# Patient Record
Sex: Male | Born: 1985 | Race: Black or African American | Hispanic: No | Marital: Single | Smoking: Current every day smoker
Health system: Southern US, Community
[De-identification: ages and names within clinical notes are randomized; demographics above are authoritative.]

---

## 2016-09-17 ENCOUNTER — Encounter (HOSPITAL_BASED_OUTPATIENT_CLINIC_OR_DEPARTMENT_OTHER): Payer: Self-pay | Admitting: Emergency Medicine

## 2016-09-17 ENCOUNTER — Emergency Department (HOSPITAL_BASED_OUTPATIENT_CLINIC_OR_DEPARTMENT_OTHER)
Admission: EM | Admit: 2016-09-17 | Discharge: 2016-09-17 | Disposition: A | Discharge status: Home / Self Care | Payer: Commercial Managed Care - PPO | Attending: Emergency Medicine | Admitting: Emergency Medicine

## 2016-09-17 ENCOUNTER — Ambulatory Visit (HOSPITAL_BASED_OUTPATIENT_CLINIC_OR_DEPARTMENT_OTHER)
Admission: RE | Admit: 2016-09-17 | Discharge: 2016-09-17 | Disposition: A | Discharge status: Home / Self Care | Payer: Commercial Managed Care - PPO | Source: Ambulatory Visit | Attending: Family Medicine | Admitting: Family Medicine

## 2016-09-17 ENCOUNTER — Other Ambulatory Visit (HOSPITAL_BASED_OUTPATIENT_CLINIC_OR_DEPARTMENT_OTHER): Payer: Self-pay | Admitting: Family Medicine

## 2016-09-17 DIAGNOSIS — S90852A Superficial foreign body, left foot, initial encounter: Secondary | ICD-10-CM | POA: Insufficient documentation

## 2016-09-17 DIAGNOSIS — W25XXXA Contact with sharp glass, initial encounter: Secondary | ICD-10-CM

## 2016-09-17 DIAGNOSIS — T1490XA Injury, unspecified, initial encounter: Secondary | ICD-10-CM

## 2016-09-17 DIAGNOSIS — W458XXA Other foreign body or object entering through skin, initial encounter: Secondary | ICD-10-CM | POA: Diagnosis not present

## 2016-09-17 DIAGNOSIS — Y929 Unspecified place or not applicable: Secondary | ICD-10-CM | POA: Insufficient documentation

## 2016-09-17 DIAGNOSIS — Y999 Unspecified external cause status: Secondary | ICD-10-CM | POA: Insufficient documentation

## 2016-09-17 DIAGNOSIS — Y939 Activity, unspecified: Secondary | ICD-10-CM | POA: Insufficient documentation

## 2016-09-17 DIAGNOSIS — F17200 Nicotine dependence, unspecified, uncomplicated: Secondary | ICD-10-CM | POA: Diagnosis not present

## 2016-09-17 MED ORDER — CEPHALEXIN 500 MG PO CAPS: 500.0000 mg | ORAL_CAPSULE | Freq: Four times a day (QID) | ORAL | 0 refills | Status: AC

## 2016-09-17 MED ORDER — LIDOCAINE-EPINEPHRINE (PF) 2 %-1:200000 IJ SOLN
20.0000 mL | Freq: Once | INTRAMUSCULAR | Status: AC
  Administered 2016-09-17: 20 mL
  Filled 2016-09-17: qty 20

## 2016-09-17 NOTE — ED Notes (Signed)
ED Provider at bedside. 

## 2016-09-17 NOTE — ED Triage Notes (Signed)
pateint states that he has pieces of glass embedded in his left heel

## 2016-09-17 NOTE — ED Notes (Signed)
Pt verbalized understanding of discharge instructions and denies any further questions at this time.   

## 2016-09-17 NOTE — ED Provider Notes (Signed)
[facility]   CSN: [id number] Arrival date & time: 09/17/16  1639  By signing my name below, I, [name], attest that this documentation has been prepared under the direction and in the presence of [name], PA-C. Electronically Signed: [name], ED Scribe. 09/17/16. 5:01 PM.  History   Chief Complaint Chief Complaint  Patient presents with  . Foreign Body in Skin    The history is provided by the patient. No language interpreter was used.    HPI Comments: Francisco Goodwin is a 31 y.o. male who presents to the Emergency Department complaining of mild, constant left foot pain s/p a foreign body injury that occurred last night. The pt reports stepping on a broken picture frame dropped by his daughter. The pt states that some of the foreign body was already removed and the area has already been X- rayed. His work x-ray was foot he has retained glass and came to the ED for removal. Patient is able to walk on his foot with little bit of pain. No other treatments prior to arrival. Tetanus is UTD. The pt denies numbness and fever.   History reviewed. No pertinent past medical history.  There are no active problems to display for this patient.   History reviewed. No pertinent surgical history.     Home Medications    Prior to Admission medications   Not on File    Family History History reviewed. No pertinent family history.  Social History Social History  Substance Use Topics  . Smoking status: Current Every Day Smoker  . Smokeless tobacco: Never Used  . Alcohol use No     Allergies   Patient has no known allergies.   Review of Systems Review of Systems  Constitutional: Negative for fever.  Musculoskeletal: Negative for gait problem.  Skin: Positive for wound.  Neurological: Negative for weakness and numbness.     Physical Exam Updated Vital Signs BP 108/72 (BP Location: Right Arm)   Pulse (!) 56   Temp 98.3  F (36.8 C) (Oral)   Resp 16   Ht 5\' 8"  (1.727 m)   Wt 68 kg (150 lb)   SpO2 99%   BMI 22.81 kg/m   Physical Exam  Constitutional: He is oriented to person, place, and time. He appears well-developed and well-nourished.  HENT:  Head: Normocephalic.  Eyes: EOM are normal.  Neck: Normal range of motion.  Cardiovascular:  Pulses:      Femoral pulses are 2+ on the right side, and 2+ on the left side. Pulmonary/Chest: Effort normal.  Abdominal: He exhibits no distension.  Musculoskeletal: Normal range of motion.  DP pulses are 2+ bilaterally. Sensation to sharp dull. Cap refill normal.  Neurological: He is alert and oriented to person, place, and time. No sensory deficit. He exhibits normal muscle tone.  Skin: Skin is warm and dry. Capillary refill takes less than 2 seconds.  Small puncture to the bottom plantar region of L heel. Minimal bleeding noted. No overlaying infection. Mildly tender to palpation   Psychiatric: He has a normal mood and affect.  Nursing note and vitals reviewed.    ED Treatments / Results   DIAGNOSTIC STUDIES: Oxygen Saturation is 100% on RA, normal by my interpretation.   COORDINATION OF CARE: 4:53 PM-Discussed next steps with pt. Pt verbalized understanding and is agreeable with the plan.   Labs (all labs ordered are listed, but only abnormal results are displayed) Labs Reviewed - No data to display  EKG  EKG Interpretation None       Radiology Dg Os Calcis Left  Result Date: 09/17/2016 CLINICAL DATA:  The patient stepped on a piece of glass last night. EXAM: LEFT OS CALCIS - 2+ VIEW COMPARISON:  None. FINDINGS: There are 2 fragments of glass in the soft tissues of the heel immediately adjacent to 1 another, 1 small and 1 11 mm in length. The bones are normal. IMPRESSION: 2 shards of glass in the soft tissues of the heel. Electronically Signed   By: Francene BoyersJames  Maxwell M.D.   On: 09/17/2016 16:16    Procedures .Foreign Body Removal Date/Time:  09/18/2016 3:56 PM Performed by: Demetrios LollLEAPHART, Jovontae Banko T Authorized by: Demetrios LollLEAPHART, Decoda Van T  Consent: Verbal consent obtained. Risks and benefits: risks, benefits and alternatives were discussed Consent given by: patient Patient understanding: patient states understanding of the procedure being performed Patient consent: the patient's understanding of the procedure matches consent given Imaging studies: imaging studies available Patient identity confirmed: verbally with patient Body area: skin General location: lower extremity Location details: left foot Anesthesia: local infiltration  Anesthesia: Local Anesthetic: lidocaine 2% with epinephrine Anesthetic total: 10 mL Removal mechanism: alligator forceps, hemostat and scalpel Dressing: antibiotic ointment and dressing applied Depth: deep Complexity: complex 2 objects recovered. Objects recovered: 2 pieces of glass  Post-procedure assessment: foreign body removed Patient tolerance: Patient tolerated the procedure well with no immediate complications .Marland Kitchen.Laceration Repair Date/Time: 09/18/2016 3:58 PM Performed by: Rise MuLEAPHART, Magdalina Whitehead T Authorized by: Demetrios LollLEAPHART, Verdene Creson T   Consent:    Consent obtained:  Verbal   Consent given by:  Patient   Risks discussed:  Infection, need for additional repair, nerve damage, poor wound healing, poor cosmetic result, pain, retained foreign body, tendon damage and vascular damage   Alternatives discussed:  No treatment Anesthesia (see MAR for exact dosages):    Anesthesia method:  Local infiltration   Local anesthetic:  Lidocaine 2% WITH epi Laceration details:    Location:  Foot   Foot location:  R heel   Length (cm):  1   Depth (mm):  5 Repair type:    Repair type:  Simple Pre-procedure details:    Preparation:  Patient was prepped and draped in usual sterile fashion and imaging obtained to evaluate for foreign bodies Exploration:    Hemostasis achieved with:  Direct pressure   Wound  exploration: wound explored through full range of motion and entire depth of wound probed and visualized     Wound extent: no foreign bodies/material noted     Contaminated: no   Treatment:    Area cleansed with:  Betadine and saline   Amount of cleaning:  Extensive   Irrigation solution:  Sterile saline   Irrigation volume:  100   Irrigation method:  Pressure wash   Visualized foreign bodies/material removed: no   Skin repair:    Repair method:  Sutures   Suture size:  4-0   Suture material:  Prolene   Suture technique:  Simple interrupted   Number of sutures:  3 Approximation:    Approximation:  Close   Vermilion border: well-aligned   Post-procedure details:    Dressing:  Antibiotic ointment, adhesive bandage and bulky dressing   Patient tolerance of procedure:  Tolerated well, no immediate complications   (including critical care time)  Medications Ordered in ED Medications  lidocaine-EPINEPHrine (XYLOCAINE W/EPI) 2 %-1:200000 (PF) injection 20 mL (20 mLs Infiltration Given 09/17/16 1715)     Initial Impression / Assessment and Plan /  ED Course  I have reviewed the triage vital signs and the nursing notes.  Pertinent labs & imaging results that were available during my care of the patient were reviewed by me and considered in my medical decision making (see chart for details).     Patient presents to the ED with complaints of retained foreign body in the right foot. Patient stepped on piece of glass last night. Pulse small piece out but had retained glass that was seen [facility] today where he had an x-ray performed. Local infiltrate was used to numb the area. Foreign body was tried to retain with hemostats and forceps unable to do so. had to advance with small incision to open the area to allow for further exploration. Was able to remove 2 pieces of glass. Minimal bleeding. Patient was neurovascularly intact. 3 sutures were placed after  significant amount of irrigation due to open wound. Patient was started on prophylactic antibiotics given the time of injury and procedure performed today. Was given podiatry follow-up. Will need wound recheck in 2-3 days. Patient verbalized understanding with the plan of care. All questions answered prior to discharge. Patient seen and evaluated by Dr. Criss Alvine who is agreeable to above plan.  Final Clinical Impressions(s) / ED Diagnoses   Final diagnoses:  Foreign body in left foot, initial encounter    New Prescriptions Discharge Medication List as of 09/17/2016  6:25 PM    START taking these medications   Details  cephALEXin (KEFLEX) 500 MG capsule Take 1 capsule (500 mg total) by mouth 4 (four) times daily., Starting Mon 09/17/2016, Print       I personally performed the services described in this documentation, which was scribed in my presence. The recorded information has been reviewed and is accurate.     [name], PA-C 09/18/16 1601    Pricilla Loveless, MD 09/20/16 [phone]

## 2016-09-17 NOTE — Discharge Instructions (Signed)
Take the antibiotic as prescribed. Follow-up with the foot center. Wound recheck and to 3 days. Sutures out in 4-5 days. Rest, ice, elevate the foot. Keep the wound clean and dry with antibiotic ointment. Watch for signs of infection and return to ED sooner if this develops.

## 2016-09-21 ENCOUNTER — Emergency Department (HOSPITAL_BASED_OUTPATIENT_CLINIC_OR_DEPARTMENT_OTHER)
Admission: EM | Admit: 2016-09-21 | Discharge: 2016-09-21 | Disposition: A | Discharge status: Home / Self Care | Payer: Commercial Managed Care - PPO | Attending: Emergency Medicine | Admitting: Emergency Medicine

## 2016-09-21 ENCOUNTER — Encounter (HOSPITAL_BASED_OUTPATIENT_CLINIC_OR_DEPARTMENT_OTHER): Payer: Self-pay | Admitting: Emergency Medicine

## 2016-09-21 DIAGNOSIS — Y929 Unspecified place or not applicable: Secondary | ICD-10-CM | POA: Insufficient documentation

## 2016-09-21 DIAGNOSIS — X58XXXA Exposure to other specified factors, initial encounter: Secondary | ICD-10-CM | POA: Diagnosis not present

## 2016-09-21 DIAGNOSIS — S99822D Other specified injuries of left foot, subsequent encounter: Secondary | ICD-10-CM | POA: Diagnosis not present

## 2016-09-21 DIAGNOSIS — F17200 Nicotine dependence, unspecified, uncomplicated: Secondary | ICD-10-CM | POA: Diagnosis not present

## 2016-09-21 DIAGNOSIS — Z4802 Encounter for removal of sutures: Secondary | ICD-10-CM

## 2016-09-21 DIAGNOSIS — S99922D Unspecified injury of left foot, subsequent encounter: Secondary | ICD-10-CM | POA: Diagnosis present

## 2016-09-21 DIAGNOSIS — Y999 Unspecified external cause status: Secondary | ICD-10-CM | POA: Insufficient documentation

## 2016-09-21 DIAGNOSIS — Y939 Activity, unspecified: Secondary | ICD-10-CM | POA: Diagnosis not present

## 2016-09-21 NOTE — ED Provider Notes (Signed)
  [facility]   CSN: [id number] Arrival date & time: 09/21/16  1456     History   Chief Complaint Chief Complaint  Patient presents with  . Suture / Staple Removal    HPI Francisco Goodwin is a 31 y.o. male.  HPI Patient presents the emergency department requesting suture removal from his left heel.  These were placed 10 days ago.  His had no complications since then.    History reviewed. No pertinent past medical history.  There are no active problems to display for this patient.   History reviewed. No pertinent surgical history.     Home Medications    Prior to Admission medications   Medication Sig Start Date End Date Taking? Authorizing Provider  cephALEXin (KEFLEX) 500 MG capsule Take 1 capsule (500 mg total) by mouth 4 (four) times daily. 09/17/16   Rise MuLeaphart, Kenneth T, PA-C    Family History History reviewed. No pertinent family history.  Social History Social History  Substance Use Topics  . Smoking status: Current Every Day Smoker  . Smokeless tobacco: Never Used  . Alcohol use No     Allergies   Patient has no known allergies.   Review of Systems Review of Systems  All other systems reviewed and are negative.    Physical Exam Updated Vital Signs BP 107/73 (BP Location: Left Arm)   Pulse (!) 59   Temp 98.4 F (36.9 C) (Oral)   Resp 16   Ht 5\' 8"  (1.727 m)   Wt 68 kg (150 lb)   SpO2 99%   BMI 22.81 kg/m   Physical Exam  Constitutional: He is oriented to person, place, and time. He appears well-developed and well-nourished.  HENT:  Head: Normocephalic.  Eyes: EOM are normal.  Neck: Normal range of motion.  Pulmonary/Chest: Effort normal.  Abdominal: He exhibits no distension.  Musculoskeletal:  Sutures in place in left heel without secondary signs of infection.  Neurological: He is alert and oriented to person, place, and time.  Psychiatric: He has a normal mood and affect.  Nursing note and vitals  reviewed.    ED Treatments / Results  Labs (all labs ordered are listed, but only abnormal results are displayed) Labs Reviewed - No data to display  EKG  EKG Interpretation None       Radiology No results found.  Procedures .Suture Removal Performed by: Azalia BilisAMPOS, Macyn Shropshire Authorized by: Azalia BilisAMPOS, Madoline Bhatt    Consent: Verbal consent obtained. Patient identity confirmed: provided demographic data Time out: Immediately prior to procedure a "time out" was called to verify the correct patient, procedure, equipment, support staff and site/side marked as required. Location: left heel Wound Appearance: clean Sutures/Staples Removed: 3 Patient tolerance: Patient tolerated the procedure well with no immediate complications.    Medications Ordered in ED Medications - No data to display   Initial Impression / Assessment and Plan / ED Course  I have reviewed the triage vital signs and the nursing notes.  Pertinent labs & imaging results that were available during my care of the patient were reviewed by me and considered in my medical decision making (see chart for details).     No secondary signs of infection.  Sutures removed without difficulty  Final Clinical Impressions(s) / ED Diagnoses   Final diagnoses:  Visit for suture removal    New Prescriptions New Prescriptions   No medications on file     Azalia Bilisampos, Cooper Moroney, MD 09/21/16 1512

## 2016-09-21 NOTE — ED Notes (Signed)
ED Provider at bedside. 

## 2016-09-21 NOTE — ED Triage Notes (Signed)
Reports to ED for suture removal.

## 2019-02-02 IMAGING — DX DG OS CALCIS 2+V*L*
2 series · 2 of 2 positions shown · non-contrast
Comparison: None.

CLINICAL DATA: The patient stepped on a piece of glass last night.

EXAM:
LEFT OS CALCIS - 2+ VIEW

[calcaneus axial]
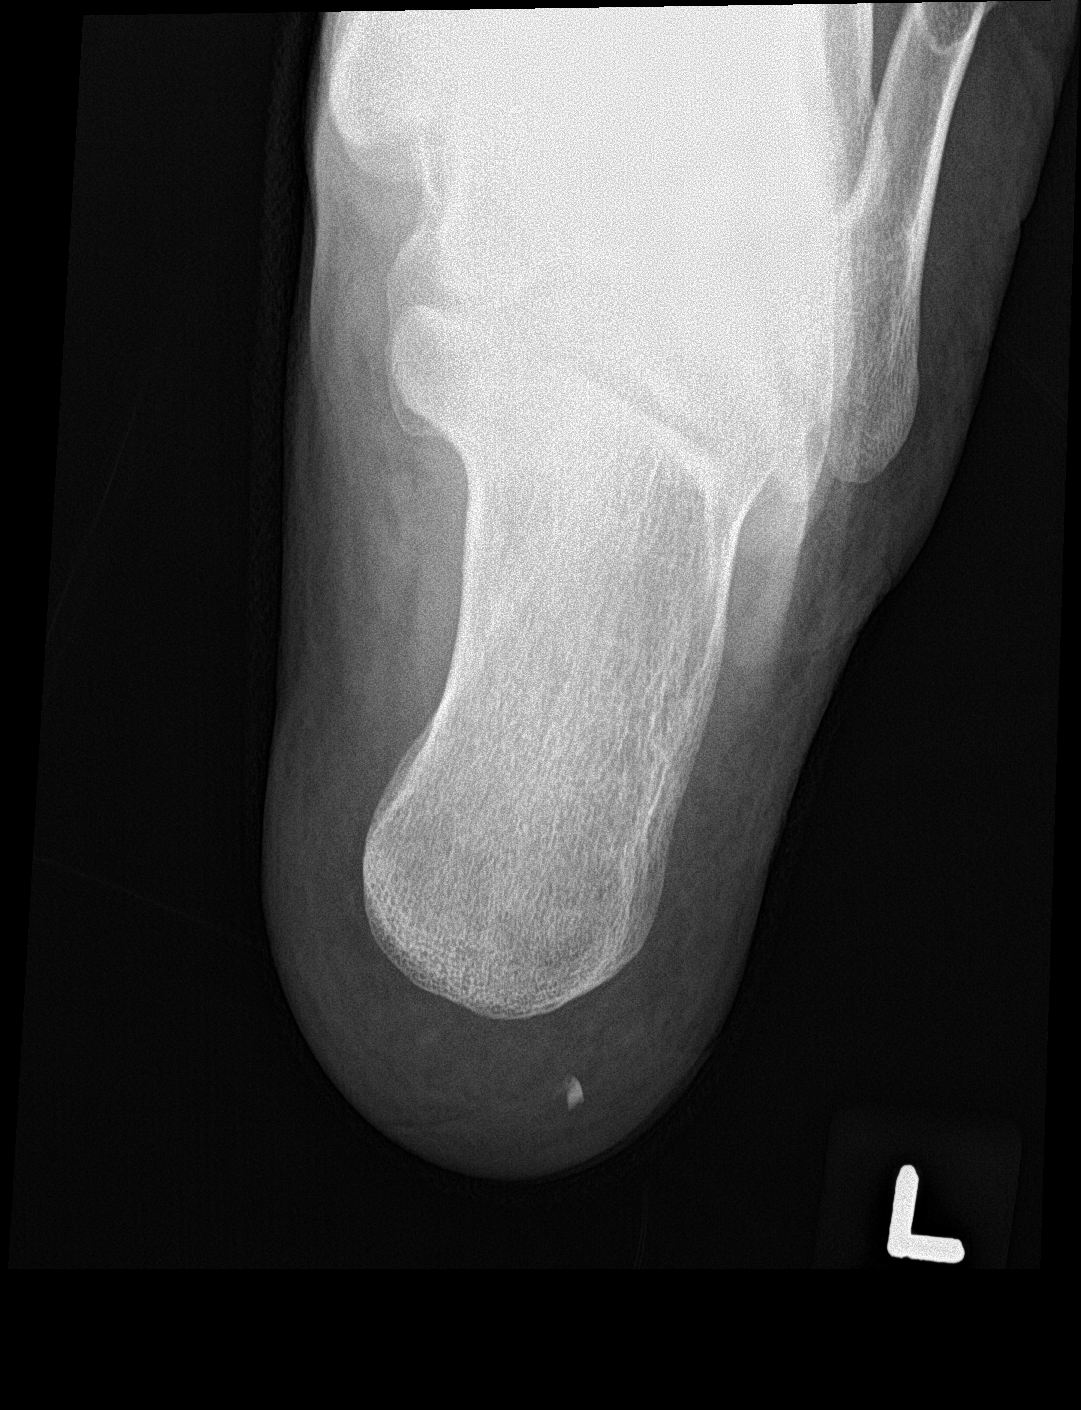

[calcaneus lat]
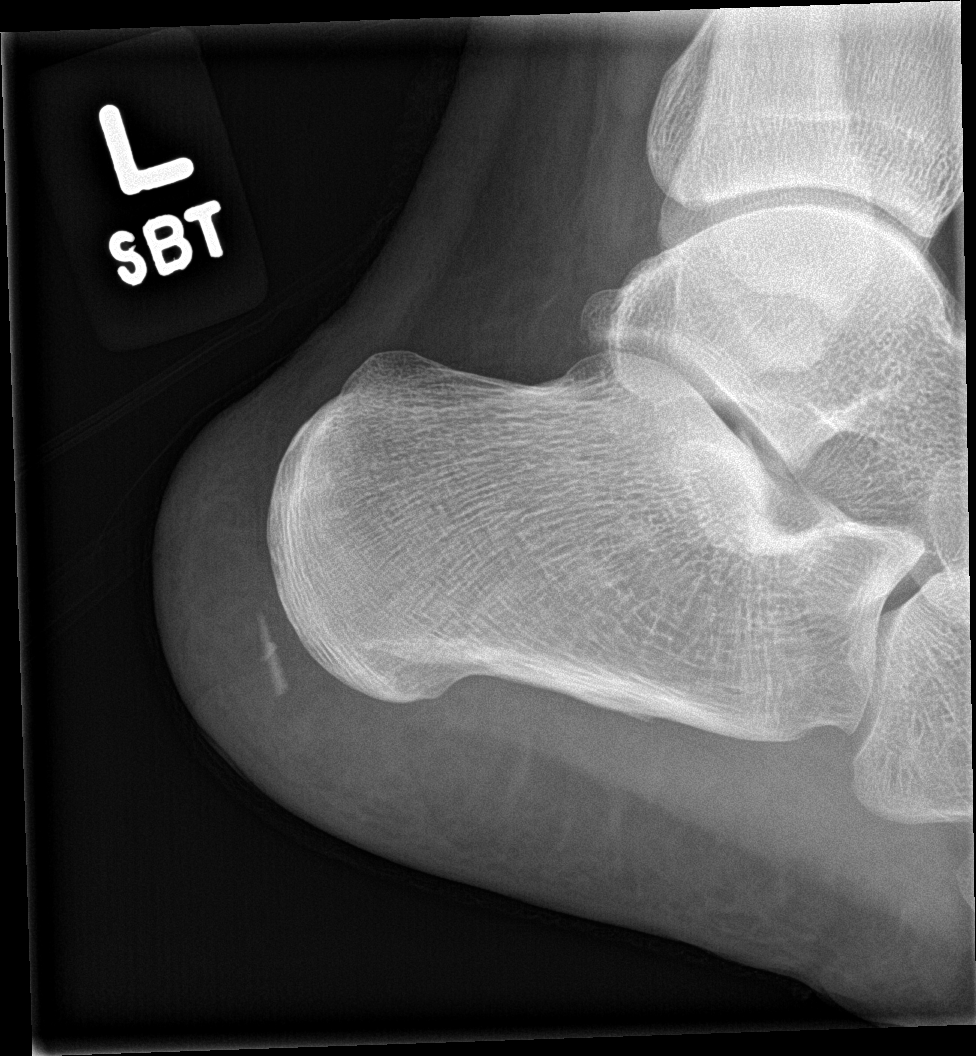

[2 of 2 positions shown; findings below may reference images not displayed]

FINDINGS: There are 2 fragments of glass in the soft tissues of the heel
immediately adjacent to 1 another, 1 small and 1 11 mm in length.

The bones are normal.
IMPRESSION: 2 shards of glass in the soft tissues of the heel.
